# Patient Record
Sex: Female | Born: 1995 | Race: White
Health system: Southern US, Community
[De-identification: ages and names within clinical notes are randomized; demographics above are authoritative.]

## PROBLEM LIST (undated history)

## (undated) DIAGNOSIS — E079 Disorder of thyroid, unspecified: Secondary | ICD-10-CM

## (undated) DIAGNOSIS — G43909 Migraine, unspecified, not intractable, without status migrainosus: Secondary | ICD-10-CM

## (undated) HISTORY — DX: Migraine, unspecified, not intractable, without status migrainosus: G43.909

## (undated) HISTORY — DX: Disorder of thyroid, unspecified: E07.9

## (undated) HISTORY — PX: KNEE SURGERY: SHX244

---

## 2015-09-20 ENCOUNTER — Emergency Department
Admission: EM | Admit: 2015-09-20 | Discharge: 2015-09-20 | Disposition: A | Discharge status: Home / Self Care | Payer: BLUE CROSS/BLUE SHIELD | Attending: Emergency Medicine | Admitting: Emergency Medicine

## 2015-09-20 ENCOUNTER — Emergency Department: Payer: BLUE CROSS/BLUE SHIELD

## 2015-09-20 ENCOUNTER — Encounter: Payer: Self-pay | Admitting: Emergency Medicine

## 2015-09-20 DIAGNOSIS — R102 Pelvic and perineal pain: Secondary | ICD-10-CM

## 2015-09-20 DIAGNOSIS — N938 Other specified abnormal uterine and vaginal bleeding: Secondary | ICD-10-CM

## 2015-09-20 DIAGNOSIS — Z3202 Encounter for pregnancy test, result negative: Secondary | ICD-10-CM | POA: Diagnosis not present

## 2015-09-20 DIAGNOSIS — R1030 Lower abdominal pain, unspecified: Secondary | ICD-10-CM | POA: Diagnosis present

## 2015-09-20 LAB — URINALYSIS COMPLETE WITH MICROSCOPIC (ARMC ONLY)
Bilirubin Urine: NEGATIVE
GLUCOSE, UA: NEGATIVE mg/dL
Ketones, ur: NEGATIVE mg/dL
NITRITE: NEGATIVE
PH: 6 (ref 5.0–8.0)
Protein, ur: NEGATIVE mg/dL
SPECIFIC GRAVITY, URINE: 1.012 (ref 1.005–1.030)

## 2015-09-20 LAB — CBC
HEMATOCRIT: 42.4 % (ref 35.0–47.0)
HEMOGLOBIN: 14.5 g/dL (ref 12.0–16.0)
MCH: 31.8 pg (ref 26.0–34.0)
MCHC: 34.1 g/dL (ref 32.0–36.0)
MCV: 93.3 fL (ref 80.0–100.0)
Platelets: 272 10*3/uL (ref 150–440)
RBC: 4.55 MIL/uL (ref 3.80–5.20)
RDW: 12.2 % (ref 11.5–14.5)
WBC: 7.3 10*3/uL (ref 3.6–11.0)

## 2015-09-20 LAB — COMPREHENSIVE METABOLIC PANEL
ALBUMIN: 4.8 g/dL (ref 3.5–5.0)
ALK PHOS: 57 U/L (ref 38–126)
ALT: 33 U/L (ref 14–54)
ANION GAP: 11 (ref 5–15)
AST: 36 U/L (ref 15–41)
BILIRUBIN TOTAL: 0.2 mg/dL — AB (ref 0.3–1.2)
BUN: 14 mg/dL (ref 6–20)
CALCIUM: 9.5 mg/dL (ref 8.9–10.3)
CO2: 23 mmol/L (ref 22–32)
Chloride: 105 mmol/L (ref 101–111)
Creatinine, Ser: 1.03 mg/dL — ABNORMAL HIGH (ref 0.44–1.00)
Glucose, Bld: 88 mg/dL (ref 65–99)
POTASSIUM: 4.1 mmol/L (ref 3.5–5.1)
Sodium: 139 mmol/L (ref 135–145)
TOTAL PROTEIN: 7.8 g/dL (ref 6.5–8.1)

## 2015-09-20 LAB — LIPASE, BLOOD: Lipase: 39 U/L (ref 11–51)

## 2015-09-20 LAB — POCT PREGNANCY, URINE: Preg Test, Ur: NEGATIVE

## 2015-09-20 NOTE — Discharge Instructions (Signed)

## 2015-09-20 NOTE — ED Provider Notes (Signed)
[facility] Emergency Department Provider Note  ____________________________________________  Time seen: On arrival  I have reviewed the triage vital signs and the nursing notes.   HISTORY  Chief Complaint Abdominal Pain and Vaginal Bleeding    HPI Jordan Rose is a 19 y.o. female who presents with complaints of lower abdominal and pelvic pain. She reports the pain is both on the left and right side occasionally but primarily in the right side. She also reports she started vaginal bleeding yesterday better. Has not occurred for 2 more weeks. Her flow has been heavy at times but is primarily spotting now. Patient notes she lifts a lot of weights but denies steroid use. She is not on birth control. No history of abdominal surgery. She is also concerned she may be pregnant     History reviewed. No pertinent past medical history.  There are no active problems to display for this patient.   Past Surgical History  Procedure Laterality Date  . Knee surgery Left     No current outpatient prescriptions on file.  Allergies Sulfa antibiotics  No family history on file.  Social History Social History  Substance Use Topics  . Smoking status: Never Smoker   . Smokeless tobacco: None  . Alcohol Use: Yes    Review of Systems  Constitutional: Negative for fever. Eyes: Negative for visual changes. ENT: Negative for sore throat Cardiovascular: Negative for chest pain. Respiratory: Negative for shortness of breath. Gastrointestinal: Lower abdominal discomfort Genitourinary: Negative for dysuria. No vaginal discharge Musculoskeletal: Negative for back pain. Skin: Negative for rash. Neurological: Negative for headaches or focal weakness Psychiatric: No anxiety    ____________________________________________   PHYSICAL EXAM:  VITAL SIGNS: ED Triage Vitals  Enc Vitals Group     BP 09/20/15 1033 130/81 mmHg     Pulse Rate 09/20/15 1033 80      Resp 09/20/15 1033 18     Temp 09/20/15 1033 99 F (37.2 C)     Temp Source 09/20/15 1033 Oral     SpO2 09/20/15 1033 99 %     Weight 09/20/15 1033 115 lb (52.164 kg)     Height 09/20/15 1033  (1.549 m)     Head Cir --      Peak Flow --      Pain Score 09/20/15 1034 7     Pain Loc --      Pain Edu? --      Excl. in GC? --      Constitutional: Alert and oriented. Well appearing and in no distress. Eyes: Conjunctivae are normal.  ENT   Head: Normocephalic and atraumatic.   Mouth/Throat: Mucous membranes are moist. Cardiovascular: Normal rate, regular rhythm. Normal and symmetric distal pulses are present in all extremities.  Respiratory: Normal respiratory effort without tachypnea nor retractions.  Gastrointestinal: Soft and non-tender in all quadrants. No distention. There is no CVA tenderness. Genitourinary: deferred per patient request Musculoskeletal: Nontender with normal range of motion in all extremities. No lower extremity tenderness nor edema. Neurologic:  Normal speech and language. No gross focal neurologic deficits are appreciated. Skin:  Skin is warm, dry and intact. No rash noted. Psychiatric: Mood and affect are normal. Patient exhibits appropriate insight and judgment.  ____________________________________________    LABS (pertinent positives/negatives)  Labs Reviewed  COMPREHENSIVE METABOLIC PANEL - Abnormal; Notable for the following:    Creatinine, Ser 1.03 (*)    Total Bilirubin 0.2 (*)    All other components within normal limits  URINALYSIS COMPLETEWITH MICROSCOPIC ([facility] ONLY) - Abnormal; Notable for the following:    Color, Urine YELLOW (*)    APPearance CLEAR (*)    Hgb urine dipstick 3+ (*)    Leukocytes, UA TRACE (*)    Bacteria, UA RARE (*)    Squamous Epithelial / LPF 6-30 (*)    All other components within normal limits  LIPASE, BLOOD  CBC  POC URINE PREG, ED  POCT PREGNANCY, URINE     ____________________________________________   EKG  None  ____________________________________________    RADIOLOGY I have personally reviewed any xrays that were ordered on this patient: Ultrasound pelvis normal  ____________________________________________   PROCEDURES  Procedure(s) performed: none  Critical Care performed: none  ____________________________________________   INITIAL IMPRESSION / ASSESSMENT AND PLAN / ED COURSE  Pertinent labs & imaging results that were available during my care of the patient were reviewed by me and considered in my medical decision making (see chart for details).  Patient well-appearing with benign exam. She complains of primarily pelvic discomfort. She denies vaginal discharge. GYN exam offered but patient declined preferring to follow up with gynecologist. Ultrasound of pelvis shows normal flow to the ovaries and no abnormalities. Given onset of vaginal bleeding and history of frequent weight lifting it is possible she is having irregular menses. I have referred her to gynecology for follow-up.  Patient is to return if any change in her symptoms  ____________________________________________   FINAL CLINICAL IMPRESSION(S) / ED DIAGNOSES  Final diagnoses:  Pelvic pain in female  DUB (dysfunctional uterine bleeding)     Jene Everyobert Lanea Vankirk, MD 09/20/15 [phone]

## 2015-09-20 NOTE — ED Notes (Signed)
Patient reports generalized lower abdominal pain/pelvic pain, states intermittently pain is worse at right side. Patient also reports vaginal bleeding. States period is not due to occur for 2 more weeks. States bleeding was heavy this am, but has now become spotting.

## 2016-12-21 IMAGING — US US PELVIS COMPLETE
1 series · 14 of 25 positions shown · non-contrast
Comparison: None.

CLINICAL DATA: Right side pelvic pain, vaginal

EXAM:
TRANSABDOMINAL AND TRANSVAGINAL ULTRASOUND OF PELVIS
DOPPLER ULTRASOUND OF OVARIES
TECHNIQUE: Both transabdominal and transvaginal ultrasound examinations of the
pelvis were performed. Transabdominal technique was performed for
global imaging of the pelvis including uterus, ovaries, adnexal
regions, and pelvic cul-de-sac.
It was necessary to proceed with endovaginal exam following the
transabdominal exam to visualize the endometrium and ovaries. Color
and duplex Doppler ultrasound was utilized to evaluate blood flow to
the ovaries.

[Series 1: us pelvis complete · 0.18mm/px · 14 of 147 slices shown]
[im 1/147]
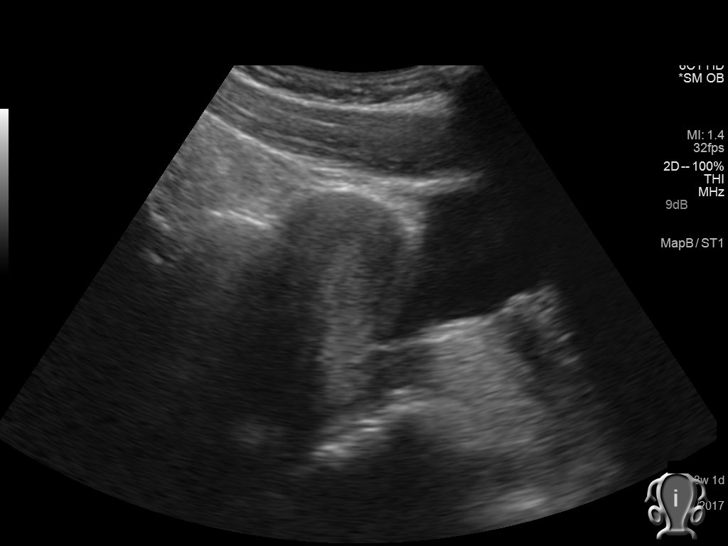
[im 13/147]
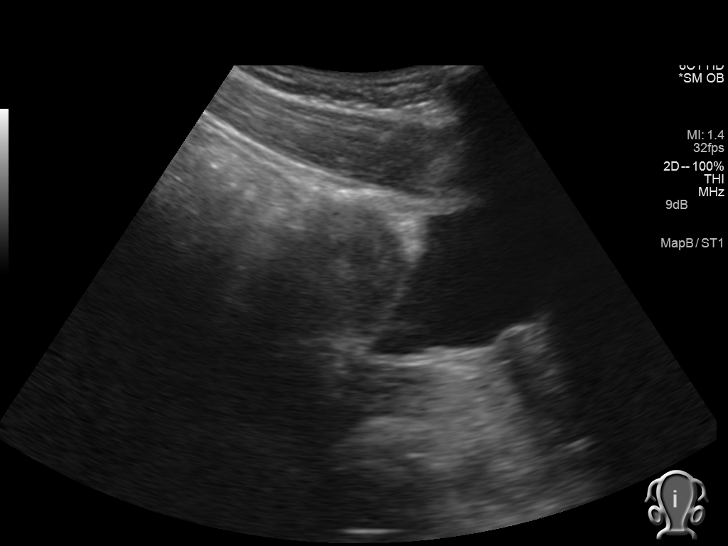
[im 25/147]
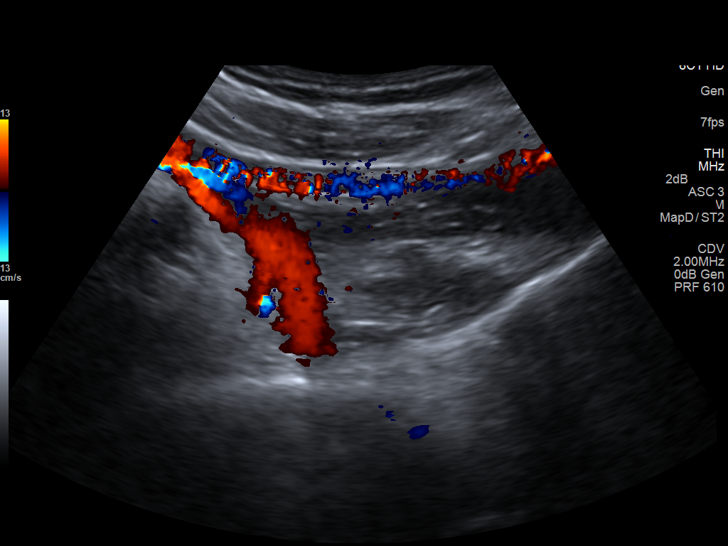
[im 37/147]
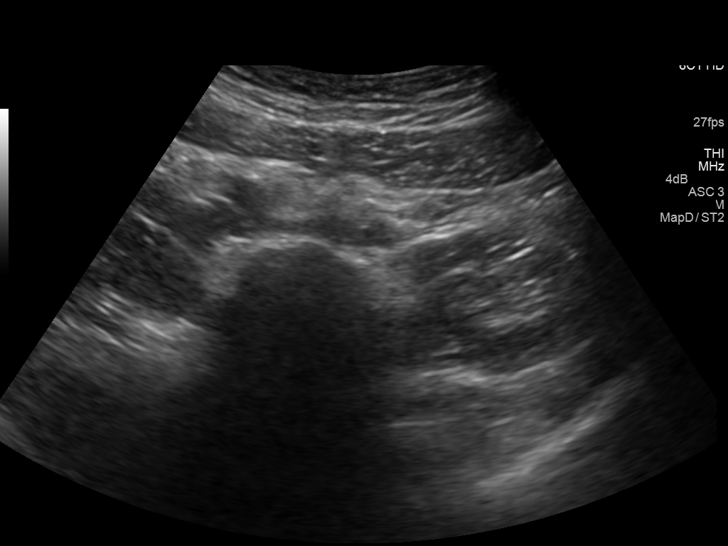
[im 49/147]
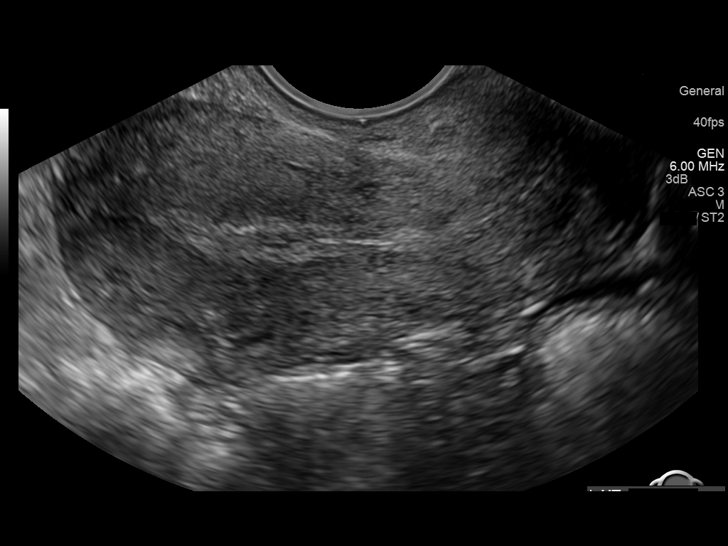
[im 55/147]
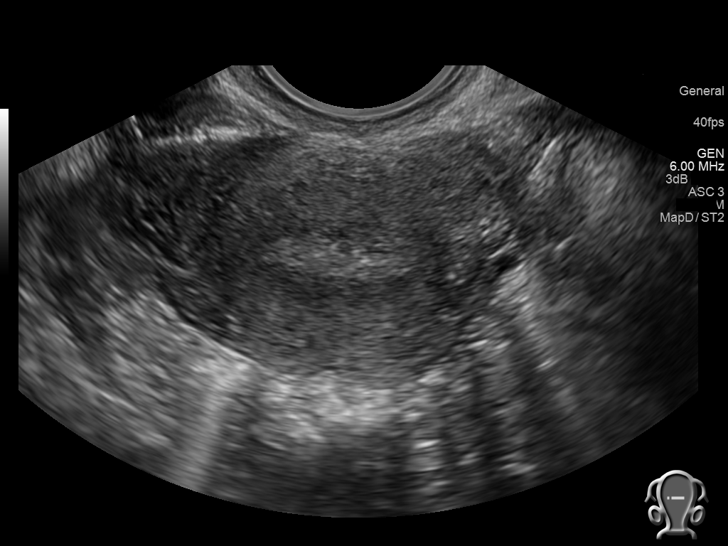
[im 67/147]
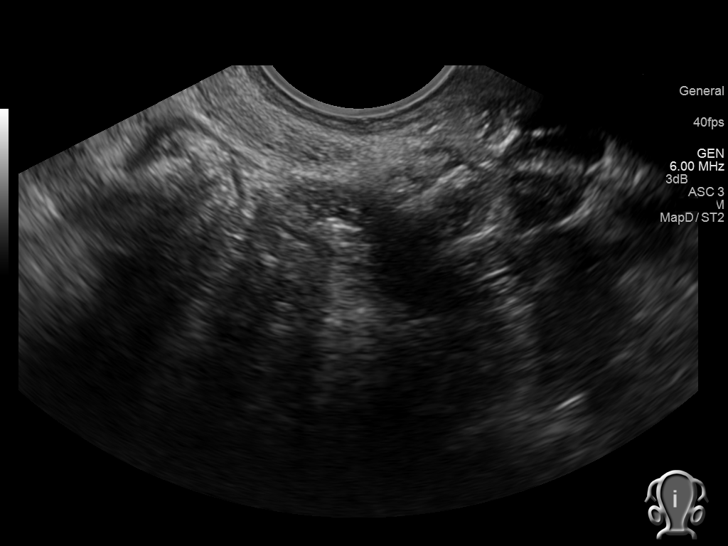
[im 80/147]
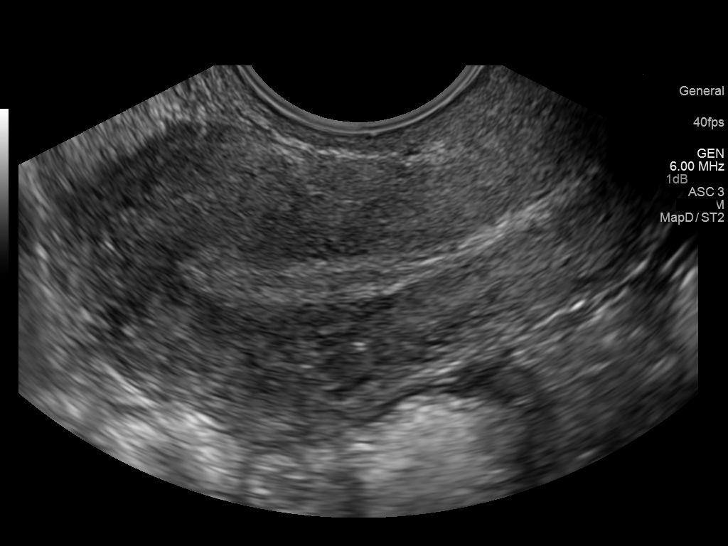
[im 92/147]
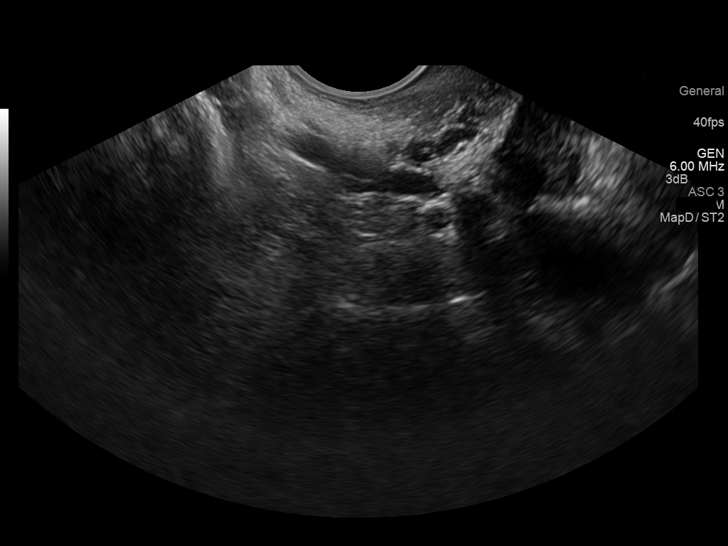
[im 98/147]
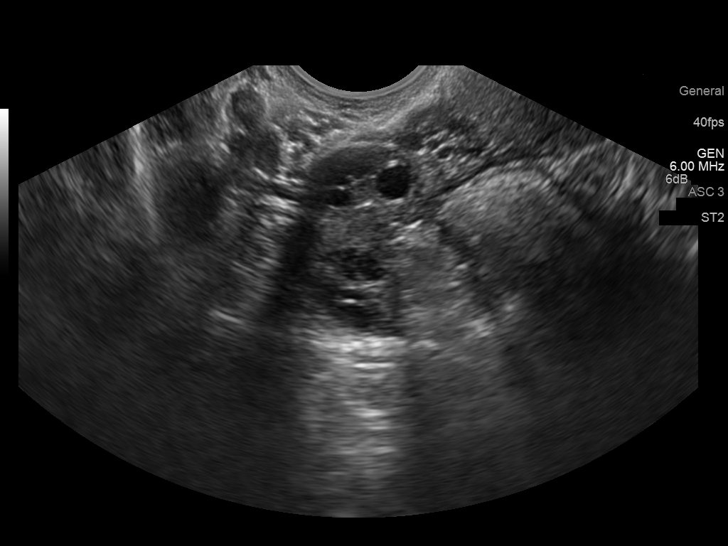
[im 110/147]
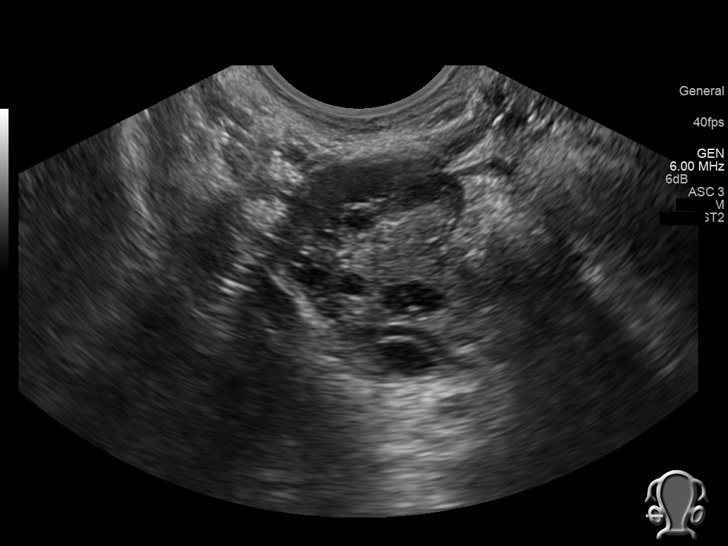
[im 122/147]
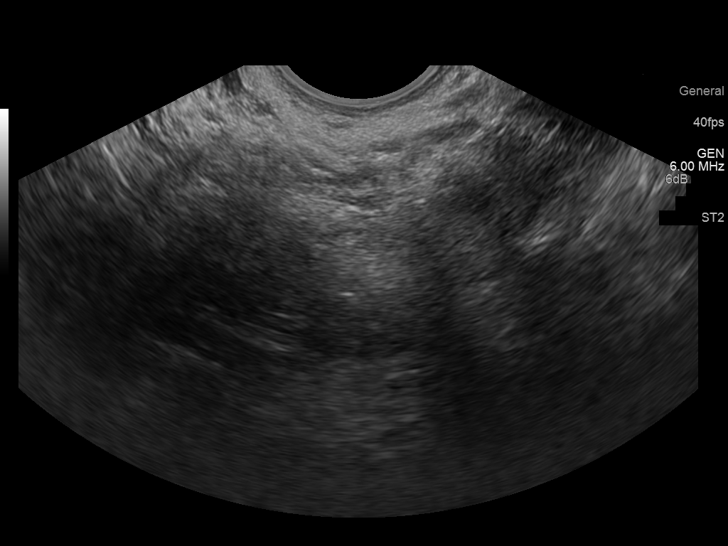
[im 134/147]
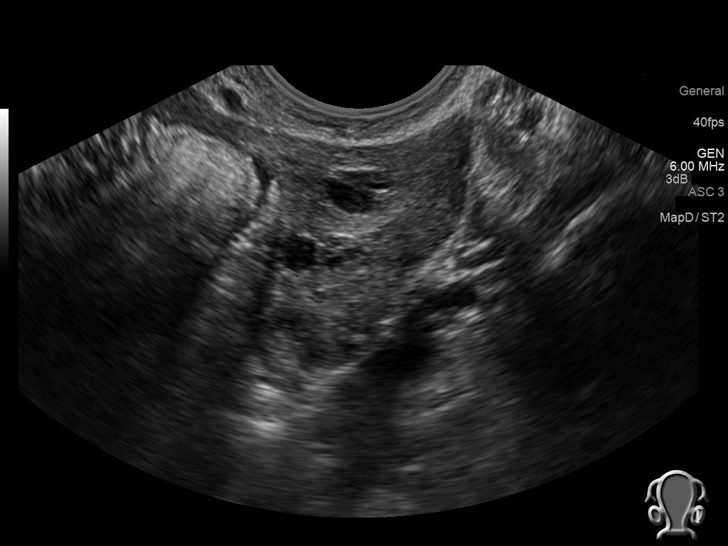
[im 147/147]
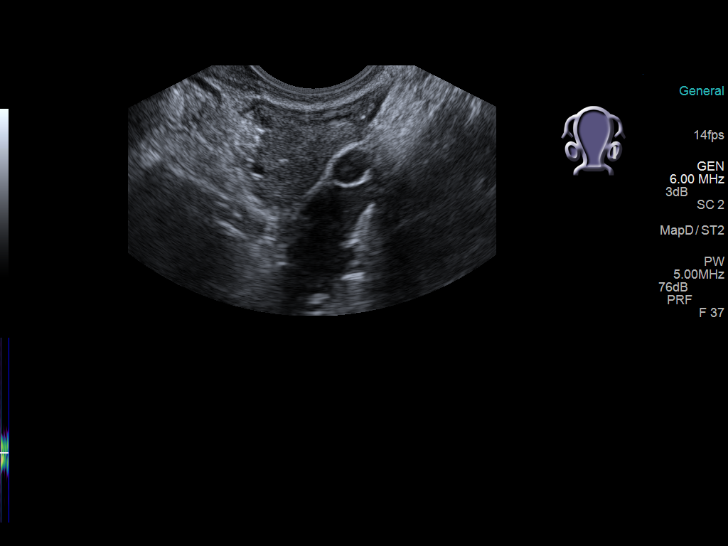

[14 of 25 positions shown; findings below may reference images not displayed]

FINDINGS: Uterus

Measurements: 7.5 x 3.4 x 4.4 cm. No fibroids or other mass
visualized.

Endometrium

Thickness: Normal thickness, 5 mm.  No focal abnormality visualized.

Right ovary

Measurements: 3.2 x 2.8 x 1.9 cm. Normal appearance/no adnexal mass.

Left ovary

Measurements: 3.5 x 2.1 x 2.3 cm. Normal appearance/no adnexal mass.

Pulsed Doppler evaluation of both ovaries demonstrates normal
low-resistance arterial and venous waveforms.

Other findings

Trace free fluid in the cul-de-sac.
IMPRESSION: Unremarkable pelvic ultrasound.

## 2020-02-02 ENCOUNTER — Other Ambulatory Visit: Payer: Self-pay | Admitting: Specialist

## 2021-08-28 ENCOUNTER — Emergency Department
Admission: EM | Admit: 2021-08-28 | Discharge: 2021-08-28 | Disposition: A | Discharge status: Home / Self Care | Payer: Worker's Comp, Other unspecified | Attending: Emergency Medicine | Admitting: Emergency Medicine

## 2021-08-28 DIAGNOSIS — S098XXA Other specified injuries of head, initial encounter: Secondary | ICD-10-CM

## 2021-08-28 DIAGNOSIS — W208XXA Other cause of strike by thrown, projected or falling object, initial encounter: Secondary | ICD-10-CM | POA: Insufficient documentation

## 2021-08-28 DIAGNOSIS — S0990XA Unspecified injury of head, initial encounter: Secondary | ICD-10-CM | POA: Insufficient documentation

## 2021-08-28 MED ORDER — IBUPROFEN 600 MG PO TABS
600.0000 mg | ORAL_TABLET | Freq: Once | ORAL | Status: AC
Start: 2021-08-28 — End: 2021-08-28
  Administered 2021-08-28: 17:00:00 600 mg via ORAL
  Filled 2021-08-28: qty 1

## 2021-08-28 NOTE — ED Provider Notes (Signed)
[facility] EMERGENCY DEPARTMENT H&P         CLINICAL SUMMARY          Diagnosis:    .     Final diagnoses:   Blunt head injury, initial encounter         MDM Notes:    25 year old female presented to the ED for evaluation of blunt head trauma.  Patient's physical and clinical exam does not show any evidence of any significant trauma.  Patient was given a head injury instruction sheet of things to look out for.  She was otherwise told to take Tylenol as needed for headache.  She is to follow-up with her primary care provider.  If she were to experience any of the signs or symptoms of the head injury sheet she is to go to the emergency department and has CT scan capability which we do not.  Patient expressed understanding.        Disposition:      Discharge         Discharge Prescriptions    None                   CLINICAL INFORMATION        HPI:      Chief Complaint: Head Injury  .    Michele Chapman is a 25 y.o. female who presents for evaluation of possible injury sustained when a TV fell on the back of her head approxi-1 hour prior to arrival.  She denies any loss of consciousness or vomiting.  She denies any blurred vision or double vision.  She denies any motor weakness, numbness or tingling.  She does complain of bitemporal headache.      History obtained from: Patient    Nursing (triage) note reviewed for the following pertinent information:         ROS:      Review of Systems   Constitutional:  Negative for chills and fever.   HENT:  Negative for congestion and sore throat.    Respiratory:  Negative for shortness of breath.    Cardiovascular:  Negative for chest pain.   Gastrointestinal:  Negative for abdominal pain, diarrhea and nausea.   Musculoskeletal:  Negative for back pain and neck pain.   Skin:  Negative for rash.   Neurological:  Positive for headaches.   All other systems reviewed and are negative.        Physical Exam:      Pulse 85  BP 141/80  Resp 16   SpO2 97 %  Temp 99 F (37.2 C)    Physical Exam  Vitals and nursing note reviewed.   Constitutional:       General: She is not in acute distress.     Appearance: Normal appearance. She is well-developed. She is not ill-appearing.   HENT:      Head: Normocephalic.      Comments: Patient has no evidence of a hematoma on the scalp.  No evidence of any erythema, no ecchymosis, no swelling or tenderness to direct palpation of the scalp diffusely.     Right Ear: Tympanic membrane normal.      Left Ear: Tympanic membrane normal.   Eyes:      Extraocular Movements: Extraocular movements intact.      Conjunctiva/sclera: Conjunctivae normal.      Pupils: Pupils are equal, round, and reactive to light.   Cardiovascular:      Rate and Rhythm: Normal rate and  regular rhythm.      Pulses: Normal pulses.   Pulmonary:      Effort: Pulmonary effort is normal. No respiratory distress.      Breath sounds: Normal breath sounds.   Musculoskeletal:         General: Normal range of motion.      Cervical back: Normal range of motion and neck supple. No tenderness.   Skin:     General: Skin is warm.      Findings: No rash.   Neurological:      General: No focal deficit present.      Mental Status: She is alert and oriented to person, place, and time.      Cranial Nerves: No cranial nerve deficit.      Sensory: No sensory deficit.      Motor: No weakness.      Coordination: Coordination normal.      Gait: Gait normal.      Deep Tendon Reflexes: Reflexes normal.   Psychiatric:         Behavior: Behavior normal.                  PAST HISTORY        Primary Care Provider: Patsy Lager, MD        PMH/PSH:    .     Past Medical History:   Diagnosis Date    Disorder of thyroid     Migraine        She has no past surgical history on file.      Social/Family History:      She reports that she has never smoked. She has never used smokeless tobacco. She reports current alcohol use. No history on file for drug use.    History reviewed. No pertinent  family history.      Listed Medications on Arrival:    .     There are no discharge medications for this patient.     Allergies: She is allergic to sulfa antibiotics.            VISIT INFORMATION        Clinical Course in the ED:             Medications Given in the ED:    .     ED Medication Orders (From admission, onward)      Start Ordered     Status Ordering Provider    08/28/21 1650 08/28/21 1649  ibuprofen (ADVIL) tablet 600 mg  Once        Route: Oral  Ordered Dose: 600 mg     Last MAR action: Given Marsha Gundlach L              Procedures:      Procedures      Interpretations:                       RESULTS        Lab Results:      Results       ** No results found for the last 24 hours. **                Radiology Results:      No orders to display               Scribe Attestation:      No scribe involved in the care of this patient  Philippa Sicks, MD  09/14/21 [phone]

## 2021-08-28 NOTE — Discharge Instructions (Addendum)
Dear Michele Chapman:    Thank you for choosing one of [facility] emergency departments.  I hope your visit today was EXCELLENT.    Specific instructions for your visit today:    Observe for the signs on the head injury sheet. If and worsening headache, not relieved with Ibuprofen, or Tylenol or your migraine medication and or if you start having and recurrent vomiting for no reason or start becoming more confused , return to a hospital ED with CT scan capability immediately. No strenous exercises or running or jumping over the next 4-5 days.    IF YOU DO NOT CONTINUE TO IMPROVE OR YOUR CONDITION WORSENS, PLEASE CONTACT YOUR DOCTOR OR RETURN IMMEDIATELY TO THE EMERGENCY DEPARTMENT.    Sincerely,  Philippa Sicks, MD  Attending Emergency Physician  [facility] Emergency Department    OBTAINING A PRIMARY CARE APPOINTMENT    Primary care physicians (PCPs, also known as primary care doctors) are either internists or family medicine doctors. Both types of PCPs focus on health promotion, disease prevention, patient education and counseling, and treatment of acute and chronic medical conditions.    Call for an appointment with a primary care doctor.  Ask to see who is taking new patients.     [facility]  telephone:  [phone]  [email or web address]    For a pediatrician, call the [facility] referral line below.  You can also call to make an appointment at [facility] (except [employer] and [facility]):    [address]  [place], [place] [address]  [phone]    [facility]  Call [phone] (available 24 hours a day, 7 days a week) if you need any further referrals and we can help you find a primary care doctor or specialist.  Also, available online at:  [email or web address]    For more information regarding our services at [facility], please call the number above or visit the website  [email or web address]    YOUR CONTACT INFORMATION  Before leaving please check with registration to make sure we have an up-to-date contact number.  You can call registration at [phone], Option 7 [facility] location) or [phone], Option 1 [facility] location) to update your information.  For questions about your hospital bill, please call [phone].  For questions about your Emergency Dept Physician bill please call [phone].      FREE HEALTH SERVICES  If you need help with health or social services, please call 2-1-1 for a free referral to resources in your area.  2-1-1 is a free service connecting people with information on health insurance, free clinics, pregnancy, mental health, dental care, food assistance, housing, and substance abuse counseling.  Also, available online at:  http://www.[facility].org    ORTHOPEDIC INJURY   Please know that significant injuries can exist even when an initial x-ray is read as normal or negative.  This can occur because some fractures (broken bones) are not initially visible on x-rays.  For this reason, close outpatient follow-up with your primary care doctor or bone specialist (orthopedist) is required.    MEDICATIONS AND FOLLOWUP  Please be aware that some prescription medications can cause drowsiness.  Use caution when driving or operating machinery.    The examination and treatment you have received in our Emergency Department is provided on an emergency basis, and is not intended to be a substitute for your primary care physician.  It is important that your doctor checks you again and  that you report any new or remaining problems at that time.        ASSISTANCE WITH INSURANCE    Affordable Care Act  [facility])  Call to start or finish an application, compare plans, enroll or ask a question.  [place]: [phone]  Web:  Healthcare.gov    Help Enrolling in [place]  250-389-3131 (TOLL-FREE)  805-267-2832 (TTY)  Web:   Http://www.coverva.org    Local Help Enrolling in the [place]  [phone] (MAIN)  Email:  health-help'@nvfs'$ .org  Web:  http://lewis-perez.info/  Address:  [address], [id number] [place], [place] [address]    SEDATING MEDICATIONS  Sedating medications include strong pain medications (e.g. narcotics), muscle relaxers, benzodiazepines (used for anxiety and as muscle relaxers), Benadryl/diphenhydramine and other antihistamines for allergic reactions/itching, and other medications.  If you are unsure if you have received a sedating medication, please ask your physician or nurse.  If you received a sedating medication: DO NOT drive a car. DO NOT operate machinery. DO NOT perform jobs where you need to be alert.  DO NOT drink alcoholic beverages while taking this medicine.     If you get dizzy, sit or lie down at the first signs. Be careful going up and down stairs.  Be extra careful to prevent falls.     Never give this medicine to others.     Keep this medicine out of reach of children.     Do not take or save old medicines. Throw them away when outdated.     Keep all medicines in a cool, dry place. DO NOT keep them in your bathroom medicine cabinet or in a cabinet above the stove.    MEDICATION REFILLS  Please be aware that we cannot refill any prescriptions through the ER. If you need further treatment from what is provided at your ER visit, please follow up with your primary care doctor or your pain management specialist.
